# Patient Record
Sex: Female | Born: 1980 | Race: White | Hispanic: No | Smoking: Current every day smoker
Health system: Southern US, Community
[De-identification: ages and names within clinical notes are randomized; demographics above are authoritative.]

## PROBLEM LIST (undated history)

## (undated) DIAGNOSIS — I10 Essential (primary) hypertension: Secondary | ICD-10-CM

---

## 2010-03-05 ENCOUNTER — Encounter: Admission: RE | Admit: 2010-03-05 | Discharge: 2010-03-05 | Payer: Self-pay | Admitting: Orthopedic Surgery

## 2011-10-14 ENCOUNTER — Other Ambulatory Visit: Payer: Self-pay | Admitting: Orthopedic Surgery

## 2011-10-14 ENCOUNTER — Ambulatory Visit
Admission: RE | Admit: 2011-10-14 | Discharge: 2011-10-14 | Disposition: A | Discharge status: Home / Self Care | Payer: Medicaid Other | Source: Ambulatory Visit | Attending: Orthopedic Surgery | Admitting: Orthopedic Surgery

## 2011-10-14 DIAGNOSIS — M545 Low back pain: Secondary | ICD-10-CM

## 2011-11-07 ENCOUNTER — Other Ambulatory Visit (HOSPITAL_BASED_OUTPATIENT_CLINIC_OR_DEPARTMENT_OTHER): Payer: Self-pay | Admitting: Sports Medicine

## 2011-11-07 ENCOUNTER — Other Ambulatory Visit (HOSPITAL_BASED_OUTPATIENT_CLINIC_OR_DEPARTMENT_OTHER): Payer: Self-pay | Admitting: Orthopedic Surgery

## 2011-11-07 DIAGNOSIS — M545 Low back pain: Secondary | ICD-10-CM

## 2011-11-09 ENCOUNTER — Ambulatory Visit (HOSPITAL_BASED_OUTPATIENT_CLINIC_OR_DEPARTMENT_OTHER)
Admission: RE | Admit: 2011-11-09 | Discharge: 2011-11-09 | Disposition: A | Discharge status: Home / Self Care | Payer: Medicaid Other | Source: Ambulatory Visit | Attending: Sports Medicine | Admitting: Sports Medicine

## 2011-11-09 DIAGNOSIS — M79609 Pain in unspecified limb: Secondary | ICD-10-CM | POA: Insufficient documentation

## 2011-11-09 DIAGNOSIS — M545 Low back pain, unspecified: Secondary | ICD-10-CM | POA: Insufficient documentation

## 2012-04-01 ENCOUNTER — Ambulatory Visit: Payer: Medicaid Other | Attending: Orthopedic Surgery | Admitting: Physical Therapy

## 2012-04-01 DIAGNOSIS — M25659 Stiffness of unspecified hip, not elsewhere classified: Secondary | ICD-10-CM | POA: Insufficient documentation

## 2012-04-01 DIAGNOSIS — IMO0001 Reserved for inherently not codable concepts without codable children: Secondary | ICD-10-CM | POA: Insufficient documentation

## 2012-04-01 DIAGNOSIS — M6281 Muscle weakness (generalized): Secondary | ICD-10-CM | POA: Insufficient documentation

## 2012-04-01 DIAGNOSIS — M545 Low back pain, unspecified: Secondary | ICD-10-CM | POA: Insufficient documentation

## 2012-04-06 ENCOUNTER — Encounter: Payer: Medicaid Other | Admitting: Physical Therapy

## 2012-04-13 ENCOUNTER — Ambulatory Visit: Payer: Medicaid Other | Admitting: Physical Therapy

## 2012-04-27 ENCOUNTER — Encounter: Payer: Medicaid Other | Admitting: Physical Therapy

## 2019-11-15 ENCOUNTER — Other Ambulatory Visit: Payer: Self-pay

## 2019-11-15 ENCOUNTER — Emergency Department (INDEPENDENT_AMBULATORY_CARE_PROVIDER_SITE_OTHER)
Admission: RE | Admit: 2019-11-15 | Discharge: 2019-11-15 | Disposition: A | Discharge status: Home / Self Care | Payer: Medicaid Other | Source: Ambulatory Visit

## 2019-11-15 ENCOUNTER — Emergency Department (INDEPENDENT_AMBULATORY_CARE_PROVIDER_SITE_OTHER): Payer: Medicaid Other

## 2019-11-15 VITALS — BP 130/80 | HR 90 | Temp 99.1°F | Resp 16

## 2019-11-15 DIAGNOSIS — R509 Fever, unspecified: Secondary | ICD-10-CM | POA: Diagnosis not present

## 2019-11-15 DIAGNOSIS — R05 Cough: Secondary | ICD-10-CM

## 2019-11-15 DIAGNOSIS — F17200 Nicotine dependence, unspecified, uncomplicated: Secondary | ICD-10-CM | POA: Diagnosis not present

## 2019-11-15 DIAGNOSIS — R059 Cough, unspecified: Secondary | ICD-10-CM

## 2019-11-15 DIAGNOSIS — J209 Acute bronchitis, unspecified: Secondary | ICD-10-CM

## 2019-11-15 DIAGNOSIS — J029 Acute pharyngitis, unspecified: Secondary | ICD-10-CM

## 2019-11-15 DIAGNOSIS — Z20822 Contact with and (suspected) exposure to covid-19: Secondary | ICD-10-CM

## 2019-11-15 DIAGNOSIS — R0981 Nasal congestion: Secondary | ICD-10-CM

## 2019-11-15 HISTORY — DX: Essential (primary) hypertension: I10

## 2019-11-15 MED ORDER — METHYLPREDNISOLONE SODIUM SUCC 40 MG IJ SOLR
80.0000 mg | Freq: Once | INTRAMUSCULAR | Status: AC
Start: 2019-11-15 — End: 2019-11-15
  Administered 2019-11-15: 80 mg via INTRAMUSCULAR

## 2019-11-15 MED ORDER — ALBUTEROL SULFATE HFA 108 (90 BASE) MCG/ACT IN AERS: 1.0000 | INHALATION_SPRAY | Freq: Four times a day (QID) | RESPIRATORY_TRACT | 0 refills | Status: DC | PRN

## 2019-11-15 MED ORDER — PREDNISONE 50 MG PO TABS: 50.0000 mg | ORAL_TABLET | Freq: Every day | ORAL | 0 refills | Status: AC

## 2019-11-15 MED ORDER — DOXYCYCLINE HYCLATE 100 MG PO CAPS: 100.0000 mg | ORAL_CAPSULE | Freq: Two times a day (BID) | ORAL | 0 refills | Status: AC

## 2019-11-15 NOTE — ED Triage Notes (Signed)
Patient presents to Urgent Care with complaints of nasal congestion, cough, runny nose since a week ago. Patient reports she has taken multiple otc medications but has not gotten relief.  Pt has not been tested for covid recently or vaccinated, would like to be tested today.

## 2019-11-15 NOTE — ED Provider Notes (Signed)
[name]    CSN: [id number] Arrival date & time: 11/15/19  1005      History   Chief Complaint Chief Complaint  Patient presents with  . Appointment    10:00  . Nasal Congestion    HPI Haley Taylor is a 39 y.o. female.   HPI Haley Taylor is a 39 y.o. female presenting to UC with c/o gradually worsening nasal congestion, mildly productive cough, and runny nose for 1 week.  She has taken multiple OTC medications without relief.  She has not been tested for covid and has not received the vaccine. She would like to be tested today. Denies fever, chills, n/v/d.    Past Medical History:  Diagnosis Date  . Hypertension     There are no problems to display for this patient.   History reviewed. No pertinent surgical history.  OB History   No obstetric history on file.      Home Medications    Prior to Admission medications   Medication Sig Start Date End Date Taking? Authorizing Provider  aspirin 81 MG chewable tablet Chew by mouth daily.   Yes [provider]  cetirizine (ZYRTEC) 5 MG tablet Take 5 mg by mouth daily.   Yes [provider]  Cyanocobalamin (VITAMIN B 12 PO) Take by mouth.   Yes [provider]  escitalopram (LEXAPRO) 20 MG tablet Take 20 mg by mouth daily.   Yes [provider]  ferrous sulfate 325 (65 FE) MG tablet Take 325 mg by mouth daily with breakfast.   Yes [provider]  lisinopril-hydrochlorothiazide (ZESTORETIC) 20-25 MG tablet Take 1 tablet by mouth daily.   Yes [provider]  albuterol (VENTOLIN HFA) 108 (90 Base) MCG/ACT inhaler Inhale 1-2 puffs into the lungs every 6 (six) hours as needed for wheezing or shortness of breath. 11/15/19   Lurene Shadow, PA-C  doxycycline (VIBRAMYCIN) 100 MG capsule Take 1 capsule (100 mg total) by mouth 2 (two) times daily for 7 days. 11/15/19 11/22/19  Lurene Shadow, PA-C  predniSONE (DELTASONE) 50 MG tablet Take 1 tablet (50 mg  total) by mouth daily with breakfast for 5 days. 11/15/19 11/20/19  Lurene Shadow, PA-C    Family History Family History  Problem Relation Age of Onset  . Healthy Mother     Social History Social History   Tobacco Use  . Smoking status: Current Every Day Smoker    Packs/day: 1.00    Types: Cigarettes  . Smokeless tobacco: Never Used  Substance Use Topics  . Alcohol use: Not Currently  . Drug use: Not on file     Allergies   Amoxicillin and Keflex [cephalexin]   Review of Systems Review of Systems  Constitutional: Negative for chills and fever.  HENT: Positive for congestion, ear pain, rhinorrhea and sore throat. Negative for trouble swallowing and voice change.   Respiratory: Positive for cough. Negative for shortness of breath.   Cardiovascular: Negative for chest pain and palpitations.  Gastrointestinal: Negative for abdominal pain, diarrhea, nausea and vomiting.  Musculoskeletal: Negative for arthralgias, back pain and myalgias.  Skin: Negative for rash.  Neurological: Positive for headaches. Negative for dizziness and light-headedness.  All other systems reviewed and are negative.    Physical Exam Triage Vital Signs ED Triage Vitals  Enc Vitals Group     BP 11/15/19 1023 130/80     Pulse Rate 11/15/19 1023 90     Resp 11/15/19 1023 16  Temp 11/15/19 1023 99.1 F (37.3 C)     Temp Source 11/15/19 1023 Oral     SpO2 11/15/19 1023 96 %     Weight --      Height --      Head Circumference --      Peak Flow --      Pain Score 11/15/19 1019 6     Pain Loc --      Pain Edu? --      Excl. in GC? --    No data found.  Updated Vital Signs BP 130/80 (BP Location: Right Arm)   Pulse 90   Temp 99.1 F (37.3 C) (Oral)   Resp 16   SpO2 96%   Visual Acuity Right Eye Distance:   Left Eye Distance:   Bilateral Distance:    Right Eye Near:   Left Eye Near:    Bilateral Near:     Physical Exam Vitals and nursing note reviewed.  Constitutional:       Appearance: Normal appearance. She is well-developed.  HENT:     Head: Normocephalic and atraumatic.     Right Ear: Tympanic membrane and ear canal normal.     Left Ear: Tympanic membrane and ear canal normal.     Nose: Nose normal.     Right Sinus: No maxillary sinus tenderness or frontal sinus tenderness.     Left Sinus: No maxillary sinus tenderness or frontal sinus tenderness.     Mouth/Throat:     Lips: Pink.     Mouth: Mucous membranes are moist.     Pharynx: Oropharynx is clear. Uvula midline.  Cardiovascular:     Rate and Rhythm: Normal rate and regular rhythm.  Pulmonary:     Effort: Pulmonary effort is normal.     Breath sounds: Wheezing and rhonchi present.     Comments: Diffuse wheeze and rhonchi without respiratory distress. Mildly productive cough throughout the exam. Musculoskeletal:        General: Normal range of motion.     Cervical back: Normal range of motion.  Skin:    General: Skin is warm and dry.  Neurological:     Mental Status: She is alert and oriented to person, place, and time.  Psychiatric:        Behavior: Behavior normal.      UC Treatments / Results  Labs (all labs ordered are listed, but only abnormal results are displayed) Labs Reviewed  SARS-COV-2 RNA,(COVID-19) QUALITATIVE NAAT    EKG   Radiology CLINICAL DATA:  1wk of cough, fever, sore throat, sinus congestion. Smoker.  EXAM: CHEST - 2 VIEW  COMPARISON:  None.  FINDINGS: The heart size and mediastinal contours are within normal limits. Mild coarsening of the interstitium bilaterally. No focal consolidation. No pneumothorax or pleural effusion. The visualized skeletal structures are unremarkable.  IMPRESSION: Bronchitic changes, possibly chronic related to smoking history.   Electronically Signed   By: Emmaline Kluver M.D.   On: 11/15/2019 10:58  Procedures Procedures (including critical care time)  Medications Ordered in UC Medications    methylPREDNISolone sodium succinate (SOLU-MEDROL) 40 mg/mL injection 80 mg (80 mg Intramuscular Given 11/15/19 1103)    Initial Impression / Assessment and Plan / UC Course  I have reviewed the triage vital signs and the nursing notes.  Pertinent labs & imaging results that were available during my care of the patient were reviewed by me and considered in my medical decision making (see chart for details).  Will tx for bacterial bronchitis Encouraged f/u with PCP AVS given  Final Clinical Impressions(s) / UC Diagnoses   Final diagnoses:  Cough  Acute bronchitis, unspecified organism  Suspected COVID-19 virus infection     Discharge Instructions      Please take antibiotics as prescribed and be sure to complete entire course even if you start to feel better to ensure infection does not come back.  You may take 500mg  acetaminophen every 4-6 hours or in combination with ibuprofen 400-600mg  every 6-8 hours as needed for pain, inflammation, and fever.  Be sure to well hydrated with clear liquids and get at least 8 hours of sleep at night, preferably more while sick.   Please follow up with family medicine in 1 week if needed.  Call 911 or have someone drive you to the hospital if you develop chest pain, worse trouble breathing, dizziness/passing out, or other new concerning symptoms develop.     ED Prescriptions    Medication Sig Dispense Auth. Provider   predniSONE (DELTASONE) 50 MG tablet Take 1 tablet (50 mg total) by mouth daily with breakfast for 5 days. 5 tablet , Nolie Bignell O, PA-C   albuterol (VENTOLIN HFA) 108 (90 Base) MCG/ACT inhaler Inhale 1-2 puffs into the lungs every 6 (six) hours as needed for wheezing or shortness of breath. 8 g 12-[birth year]-1986, PA-C   doxycycline (VIBRAMYCIN) 100 MG capsule Take 1 capsule (100 mg total) by mouth 2 (two) times daily for 7 days. 14 capsule Lurene Shadow, Lurene Shadow     PDMP not reviewed this encounter.   [place],  PA-C 11/18/19 1022

## 2019-11-15 NOTE — Discharge Instructions (Signed)
°  Please take antibiotics as prescribed and be sure to complete entire course even if you start to feel better to ensure infection does not come back.  You may take 500mg  acetaminophen every 4-6 hours or in combination with ibuprofen 400-600mg  every 6-8 hours as needed for pain, inflammation, and fever.  Be sure to well hydrated with clear liquids and get at least 8 hours of sleep at night, preferably more while sick.   Please follow up with family medicine in 1 week if needed.  Call 911 or have someone drive you to the hospital if you develop chest pain, worse trouble breathing, dizziness/passing out, or other new concerning symptoms develop.

## 2019-11-16 LAB — SARS-COV-2 RNA,(COVID-19) QUALITATIVE NAAT: SARS CoV2 RNA: NOT DETECTED

## 2020-04-17 ENCOUNTER — Emergency Department (INDEPENDENT_AMBULATORY_CARE_PROVIDER_SITE_OTHER)
Admission: EM | Admit: 2020-04-17 | Discharge: 2020-04-17 | Disposition: A | Discharge status: Home / Self Care | Payer: Medicaid Other | Source: Home / Self Care

## 2020-04-17 ENCOUNTER — Other Ambulatory Visit: Payer: Self-pay

## 2020-04-17 ENCOUNTER — Emergency Department (INDEPENDENT_AMBULATORY_CARE_PROVIDER_SITE_OTHER): Payer: Medicaid Other

## 2020-04-17 ENCOUNTER — Encounter: Payer: Self-pay | Admitting: Emergency Medicine

## 2020-04-17 DIAGNOSIS — S0591XA Unspecified injury of right eye and orbit, initial encounter: Secondary | ICD-10-CM | POA: Diagnosis not present

## 2020-04-17 DIAGNOSIS — H538 Other visual disturbances: Secondary | ICD-10-CM

## 2020-04-17 DIAGNOSIS — H05231 Hemorrhage of right orbit: Secondary | ICD-10-CM
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
## 2020-04-17 NOTE — Discharge Instructions (Signed)
°  Due to worsening eye pain and vision without definite scratches or fractures seen in urgent care this evening, it is recommended you have someone drive you to the hospital this evening for further evaluation of your eye where more resources are available.
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
## 2020-05-02 ENCOUNTER — Emergency Department (INDEPENDENT_AMBULATORY_CARE_PROVIDER_SITE_OTHER)
Admission: EM | Admit: 2020-05-02 | Discharge: 2020-05-02 | Disposition: A | Discharge status: Home / Self Care | Payer: Medicaid Other | Source: Home / Self Care

## 2020-05-02 ENCOUNTER — Encounter: Payer: Self-pay | Admitting: Emergency Medicine

## 2020-05-02 ENCOUNTER — Other Ambulatory Visit: Payer: Self-pay

## 2020-05-02 DIAGNOSIS — J069 Acute upper respiratory infection, unspecified: Secondary | ICD-10-CM

## 2020-05-02 MED ORDER — ALBUTEROL SULFATE HFA 108 (90 BASE) MCG/ACT IN AERS: 1.0000 | INHALATION_SPRAY | Freq: Four times a day (QID) | RESPIRATORY_TRACT | 0 refills | Status: AC | PRN

## 2020-05-02 MED ORDER — BENZONATATE 100 MG PO CAPS: 100.0000 mg | ORAL_CAPSULE | Freq: Three times a day (TID) | ORAL | 0 refills | Status: DC | PRN

## 2020-05-02 MED ORDER — PREDNISONE 20 MG PO TABS: 40.0000 mg | ORAL_TABLET | Freq: Every day | ORAL | 0 refills | Status: DC

## 2020-05-02 NOTE — Discharge Instructions (Addendum)
Your COVID 19 results will be available in 3-5 days. Activate MyChart to view your results. Positive results will receive a follow-up call from our clinic. If symptoms are present, I recommend home quarantine until results are known.

## 2020-05-02 NOTE — ED Triage Notes (Signed)
Nasal congestion, Left chest congestion x 1 week Unvaccinated

## 2020-05-02 NOTE — ED Provider Notes (Signed)
[name]    CSN: [id number] Arrival date & time: 05/02/20  1129      History   Chief Complaint Chief Complaint  Patient presents with  . Nasal Congestion    HPI Haley Taylor is a 40 y.o. female.   HPI  Patient presents today with 1 week history of cough, sinus congestion, otalgia.  Patient has been afebrile. She has had sick contacts although unaware if persons have been infected with COVID or flu. Endorses wheezing and shortness of breath intermittently. History of bronchitis. Current smoker. Past Medical History:  Diagnosis Date  . Hypertension     There are no problems to display for this patient.   History reviewed. No pertinent surgical history.  OB History   No obstetric history on file.      Home Medications    Prior to Admission medications   Medication Sig Start Date End Date Taking? Authorizing Provider  benzonatate (TESSALON) 100 MG capsule Take 1-2 capsules (100-200 mg total) by mouth 3 (three) times daily as needed for cough. 05/02/20  Yes Bing Neighbors, FNP  predniSONE (DELTASONE) 20 MG tablet Take 2 tablets (40 mg total) by mouth daily with breakfast. 05/02/20  Yes Bing Neighbors, FNP  albuterol (VENTOLIN HFA) 108 (90 Base) MCG/ACT inhaler Inhale 1-2 puffs into the lungs every 6 (six) hours as needed for wheezing or shortness of breath. 05/02/20   Bing Neighbors, FNP  aspirin 81 MG chewable tablet Chew by mouth daily.    [provider]  cetirizine (ZYRTEC) 5 MG tablet Take 5 mg by mouth daily.    [provider]  Cyanocobalamin (VITAMIN B 12 PO) Take by mouth.    [provider]  escitalopram (LEXAPRO) 20 MG tablet Take 20 mg by mouth daily.    [provider]  ferrous sulfate 325 (65 FE) MG tablet Take 325 mg by mouth daily with breakfast.    [provider]  lisinopril-hydrochlorothiazide (ZESTORETIC) 20-25 MG tablet Take 1 tablet by mouth daily.    [provider]     Family History Family History  Problem Relation Age of Onset  . COPD Mother     Social History Social History   Tobacco Use  . Smoking status: Current Every Day Smoker    Packs/day: 1.00    Types: Cigarettes  . Smokeless tobacco: Never Used  Vaping Use  . Vaping Use: Never used  Substance Use Topics  . Alcohol use: Not Currently     Allergies   Amoxicillin and Keflex [cephalexin]   Review of Systems Review of Systems Pertinent negatives listed in HPI Physical Exam Triage Vital Signs ED Triage Vitals  Enc Vitals Group     BP 05/02/20 1226 136/78     Pulse Rate 05/02/20 1226 75     Resp 05/02/20 1226 17     Temp 05/02/20 1226 98.3 F (36.8 C)     Temp Source 05/02/20 1226 Oral     SpO2 05/02/20 1226 98 %     Weight 05/02/20 1227 200 lb (90.7 kg)     Height 05/02/20 1227 5\' 7"  (1.702 m)     Head Circumference --      Peak Flow --      Pain Score 05/02/20 1227 5     Pain Loc --      Pain Edu? --      Excl. in GC? --    No data found.  Updated Vital Signs BP 136/78 (  BP Location: Right Arm)   Pulse 75   Temp 98.3 F (36.8 C) (Oral)   Resp 17   Ht 5\' 7"  (1.702 m)   Wt 200 lb (90.7 kg)   LMP 04/11/2020 (Exact Date)   SpO2 98%   BMI 31.32 kg/m   Visual Acuity Right Eye Distance:   Left Eye Distance:   Bilateral Distance:    Right Eye Near:   Left Eye Near:    Bilateral Near:     Physical Exam General appearance: alert, Ill-appearing, no distress Head: Normocephalic, without obvious abnormality, atraumatic ENT: mucosal edema, congestion, oropharynx  Respiratory: Respirations even , unlabored, coarse lung sound, expiratory wheeze Heart: rate and rhythm normal. No gallop or murmurs noted on exam  Abdomen: BS +, no distention, no rebound tenderness, or no mass Extremities: No gross deformities Skin: Skin color, texture, turgor normal. No rashes seen  Psych: Appropriate mood and affect. Neurologic: Mental status: Alert, oriented to person,  place, and time, thought content appropriate. UC Treatments / Results  Labs (all labs ordered are listed, but only abnormal results are displayed) Labs Reviewed  COVID-19, FLU A+B NAA    EKG   Radiology No results found.  Procedures Procedures (including critical care time)  Medications Ordered in UC Medications - No data to display  Initial Impression / Assessment and Plan / UC Course  I have reviewed the triage vital signs and the nursing notes.  Pertinent labs & imaging results that were available during my care of the patient were reviewed by me and considered in my medical decision making (see chart for details).     COVID/Flu test pending. Symptom management warranted only. Treatment per discharge medications. Red flag precautions given.  Final Clinical Impressions(s) / UC Diagnoses   Final diagnoses:  Viral URI with cough     Discharge Instructions     Your COVID 19 results will be available in 3-5 days. Activate MyChart to view your results. Positive results will receive a follow-up call from our clinic. If symptoms are present, I recommend home quarantine until results are known.     ED Prescriptions    Medication Sig Dispense Auth. Provider   albuterol (VENTOLIN HFA) 108 (90 Base) MCG/ACT inhaler Inhale 1-2 puffs into the lungs every 6 (six) hours as needed for wheezing or shortness of breath. 8 g 04/13/2020, FNP   predniSONE (DELTASONE) 20 MG tablet Take 2 tablets (40 mg total) by mouth daily with breakfast. 10 tablet Bing Neighbors, FNP   benzonatate (TESSALON) 100 MG capsule Take 1-2 capsules (100-200 mg total) by mouth 3 (three) times daily as needed for cough. 40 capsule Bing Neighbors, FNP     PDMP not reviewed this encounter.   Bing Neighbors, FNP 05/02/20 1332

## 2020-05-04 LAB — COVID-19, FLU A+B NAA
Influenza A, NAA: NOT DETECTED
Influenza B, NAA: NOT DETECTED
SARS-CoV-2, NAA: NOT DETECTED

## 2020-11-01 ENCOUNTER — Other Ambulatory Visit: Payer: Self-pay | Admitting: Family Medicine

## 2021-09-19 ENCOUNTER — Emergency Department (INDEPENDENT_AMBULATORY_CARE_PROVIDER_SITE_OTHER)
Admission: EM | Admit: 2021-09-19 | Discharge: 2021-09-19 | Disposition: A | Discharge status: Home / Self Care | Payer: Medicaid Other | Source: Home / Self Care

## 2021-09-19 ENCOUNTER — Emergency Department (INDEPENDENT_AMBULATORY_CARE_PROVIDER_SITE_OTHER): Payer: Medicaid Other

## 2021-09-19 DIAGNOSIS — M545 Low back pain, unspecified: Secondary | ICD-10-CM

## 2021-09-19 DIAGNOSIS — M5432 Sciatica, left side: Secondary | ICD-10-CM

## 2021-09-19 DIAGNOSIS — S39012A Strain of muscle, fascia and tendon of lower back, initial encounter: Secondary | ICD-10-CM

## 2021-09-19 MED ORDER — METHYLPREDNISOLONE SODIUM SUCC 125 MG IJ SOLR
125.0000 mg | Freq: Once | INTRAMUSCULAR | Status: AC
  Administered 2021-09-19: 125 mg via INTRAMUSCULAR

## 2021-09-19 MED ORDER — BACLOFEN 10 MG PO TABS: 10.0000 mg | ORAL_TABLET | Freq: Three times a day (TID) | ORAL | 0 refills | Status: DC

## 2021-09-19 MED ORDER — METHYLPREDNISOLONE 4 MG PO TBPK: ORAL_TABLET | ORAL | 0 refills | Status: DC

## 2021-09-19 NOTE — ED Provider Notes (Signed)
[facility]    CSN: [id number] Arrival date & time: 09/19/21  1033      History   Chief Complaint Chief Complaint  Patient presents with   Back Pain    HPI Haley Taylor is a 41 y.o. female.   HPI 41 year old female presents with lower back pain since yesterday, reports she bent down to pick something up when her lower back locked up on her.  Reports previous history of back surgeries.  Patient reports using heat, ice, and pain meds as needed, reports current lower back pain as 7 of 10.  PMH significant for obesity and HTN.  MRI of 11/09/2011 revealed mild annular bulging of L1-L2 with left L5 and left S1 nerve root compression are present.  Past Medical History:  Diagnosis Date   Hypertension     There are no problems to display for this patient.   History reviewed. No pertinent surgical history.  OB History   No obstetric history on file.      Home Medications    Prior to Admission medications   Medication Sig Start Date End Date Taking? Authorizing Provider  baclofen (LIORESAL) 10 MG tablet Take 1 tablet (10 mg total) by mouth 3 (three) times daily. 09/19/21  Yes Trevor Iha, FNP  methylPREDNISolone (MEDROL DOSEPAK) 4 MG TBPK tablet Take as directed 09/19/21  Yes Trevor Iha, FNP  albuterol (VENTOLIN HFA) 108 (90 Base) MCG/ACT inhaler Inhale 1-2 puffs into the lungs every 6 (six) hours as needed for wheezing or shortness of breath. 05/02/20   Bing Neighbors, FNP  aspirin 81 MG chewable tablet Chew by mouth daily.    [provider]  cetirizine (ZYRTEC) 5 MG tablet Take 5 mg by mouth daily.    [provider]  Cyanocobalamin (VITAMIN B 12 PO) Take by mouth.    [provider]  escitalopram (LEXAPRO) 20 MG tablet Take 20 mg by mouth daily.    [provider]  ferrous sulfate 325 (65 FE) MG tablet Take 325 mg by mouth daily with breakfast.    [provider]  lisinopril-hydrochlorothiazide (ZESTORETIC)  20-25 MG tablet Take 1 tablet by mouth daily.    [provider]    Family History Family History  Problem Relation Age of Onset   COPD Mother     Social History Social History   Tobacco Use   Smoking status: Every Day    Packs/day: 1.00    Types: Cigarettes   Smokeless tobacco: Never  Vaping Use   Vaping Use: Never used  Substance Use Topics   Alcohol use: Not Currently     Allergies   Amoxicillin and Keflex [cephalexin]   Review of Systems Review of Systems  Musculoskeletal:  Positive for back pain.    Physical Exam Triage Vital Signs ED Triage Vitals  Enc Vitals Group     BP 09/19/21 1047 (!) 145/86     Pulse Rate 09/19/21 1047 78     Resp 09/19/21 1047 17     Temp 09/19/21 1047 98.1 F (36.7 C)     Temp Source 09/19/21 1047 Oral     SpO2 09/19/21 1047 99 %     Weight --      Height --      Head Circumference --      Peak Flow --      Pain Score 09/19/21 1049 7     Pain Loc --      Pain Edu? --  Excl. in GC? --    No data found.  Updated Vital Signs BP (!) 145/86 (BP Location: Right Arm)   Pulse 78   Temp 98.1 F (36.7 C) (Oral)   Resp 17   LMP 09/14/2021 (Approximate)   SpO2 99%       Physical Exam Vitals and nursing note reviewed.  Constitutional:      Appearance: Normal appearance. She is normal weight.  HENT:     Head: Normocephalic and atraumatic.     Mouth/Throat:     Mouth: Mucous membranes are moist.     Pharynx: Oropharynx is clear.  Eyes:     Extraocular Movements: Extraocular movements intact.     Conjunctiva/sclera: Conjunctivae normal.     Pupils: Pupils are equal, round, and reactive to light.  Cardiovascular:     Rate and Rhythm: Normal rate and regular rhythm.     Pulses: Normal pulses.     Heart sounds: Normal heart sounds.  Pulmonary:     Effort: Pulmonary effort is normal.     Breath sounds: Normal breath sounds. No wheezing, rhonchi or rales.  Musculoskeletal:     Cervical back: Normal range of  motion and neck supple.     Comments: Lumbar sacral spine: TTP over left-sided paraspinous muscles and inferior spinal erectors, no deformity noted  Skin:    General: Skin is warm and dry.  Neurological:     General: No focal deficit present.     Mental Status: She is alert and oriented to person, place, and time. Mental status is at baseline.     UC Treatments / Results  Labs (all labs ordered are listed, but only abnormal results are displayed) Labs Reviewed - No data to display  EKG   Radiology DG Lumbar Spine Complete  Result Date: 09/19/2021 CLINICAL DATA:  Acute lower back pain. EXAM: LUMBAR SPINE - COMPLETE 4+ VIEW COMPARISON:  October 14, 2011. FINDINGS: There is no evidence of lumbar spine fracture. Alignment is normal. Moderate degenerative disc disease is noted at L1-2 and L3-4 and L5-S1. IMPRESSION: Moderate multilevel degenerative disc disease. No acute abnormality is noted. Electronically Signed   By: Lupita RaiderJames  Green Jr M.D.   On: 09/19/2021 11:51    Procedures Procedures (including critical care time)  Medications Ordered in UC Medications  methylPREDNISolone sodium succinate (SOLU-MEDROL) 125 mg/2 mL injection 125 mg (125 mg Intramuscular Given 09/19/21 1208)    Initial Impression / Assessment and Plan / UC Course  I have reviewed the triage vital signs and the nursing notes.  Pertinent labs & imaging results that were available during my care of the patient were reviewed by me and considered in my medical decision making (see chart for details).     MDM: 1.  Sciatica of left side-IM Solu-Medrol 125 mg given once in clinic, Rx'd Medrol Dosepak; 2.  Strain of lumbar region, initial encounter-Rx'd Baclofen. Advised patient of lumbar spine x-ray results with hard copy provided to patient.  Instructed patient to take medication as directed with food to completion.  Advised patient may take baclofen daily or as needed for accompanying muscle spasms.  Advised/encouraged  patient to avoid repetitive overuse, offending activities involving affected area of left lower back for the next 7 to 10 days. Encouraged patient to increase daily water intake while taking these medications.  Advised patient if symptoms worsen and/or unresolved please follow-up with PCP or here for further evaluation.  Final Clinical Impressions(s) / UC Diagnoses   Final diagnoses:  Strain  of lumbar region, initial encounter  Sciatica of left side     Discharge Instructions      Advised patient of lumbar spine x-ray results with hard copy provided to patient.  Instructed patient to take medication as directed with food to completion.  Advised patient may take baclofen daily or as needed for accompanying muscle spasms.  Advised/encouraged patient to avoid repetitive overuse, offending activities involving affected area of left lower back for the next 7 to 10 days.  Encouraged patient to increase daily water intake while taking these medications.  Advised patient if symptoms worsen and/or unresolved please follow-up with PCP or here for further evaluation.     ED Prescriptions     Medication Sig Dispense Auth. Provider   methylPREDNISolone (MEDROL DOSEPAK) 4 MG TBPK tablet Take as directed 1 each Trevor Iha, FNP   baclofen (LIORESAL) 10 MG tablet Take 1 tablet (10 mg total) by mouth 3 (three) times daily. 30 each Trevor Iha, FNP      I have reviewed the PDMP during this encounter.   Trevor Iha, FNP 09/19/21 1215

## 2021-09-19 NOTE — Discharge Instructions (Addendum)
Advised patient of lumbar spine x-ray results with hard copy provided to patient.  Instructed patient to take medication as directed with food to completion.  Advised patient may take baclofen daily or as needed for accompanying muscle spasms.  Advised/encouraged patient to avoid repetitive overuse, offending activities involving affected area of left lower back for the next 7 to 10 days.  Encouraged patient to increase daily water intake while taking these medications.  Advised patient if symptoms worsen and/or unresolved please follow-up with PCP or here for further evaluation.

## 2021-09-19 NOTE — ED Triage Notes (Signed)
Pt c/o back pain since yesterday. Says she bent down to pick something up when "it locked up". Previous hx of back surgeries. Heat, ice and pain meds prn. Pain 7/10

## 2022-02-13 ENCOUNTER — Encounter: Payer: Self-pay | Admitting: Emergency Medicine

## 2022-02-13 ENCOUNTER — Ambulatory Visit
Admission: EM | Admit: 2022-02-13 | Discharge: 2022-02-13 | Disposition: A | Discharge status: Home / Self Care | Payer: Medicaid Other | Attending: Family Medicine | Admitting: Family Medicine

## 2022-02-13 DIAGNOSIS — J4521 Mild intermittent asthma with (acute) exacerbation: Secondary | ICD-10-CM | POA: Diagnosis not present

## 2022-02-13 LAB — POC SARS CORONAVIRUS 2 AG -  ED: SARS Coronavirus 2 Ag: NEGATIVE

## 2022-02-13 MED ORDER — HYDROCOD POLI-CHLORPHE POLI ER 10-8 MG/5ML PO SUER: 5.0000 mL | Freq: Two times a day (BID) | ORAL | 0 refills | Status: DC | PRN

## 2022-02-13 MED ORDER — AZITHROMYCIN 250 MG PO TABS
ORAL_TABLET | ORAL | 0 refills | Status: DC
Start: 2022-02-13 — End: 2023-09-04

## 2022-02-13 MED ORDER — PREDNISONE 20 MG PO TABS: 40.0000 mg | ORAL_TABLET | Freq: Every day | ORAL | 0 refills | Status: DC

## 2022-02-13 NOTE — ED Triage Notes (Signed)
Pt c/o cough, nasal and chest congestion since last Friday. States tmax 100 at home. Taking OTC cough and cold meds with no relief.

## 2022-02-13 NOTE — ED Provider Notes (Signed)
[name]    CSN: [id number] Arrival date & time: 02/13/22  1654      History   Chief Complaint Chief Complaint  Patient presents with   Nasal Congestion    HPI Haley Taylor is a 41 y.o. female.   HPI Patient is a smoker.  She is here with her upper respiratory infection cough.  Shortness of breath and wheezing.  Has been using her albuterol inhaler.  Has not been able to work this week because of the severe cough.  She is coughing up sputum.  A COVID test at home was negative.  She has had cough, nasal and chest congestion, shortness of breath, headache and body aches.  Tmax at home was 100 degrees. Past Medical History:  Diagnosis Date   Hypertension     There are no problems to display for this patient.   History reviewed. No pertinent surgical history.  OB History   No obstetric history on file.      Home Medications    Prior to Admission medications   Medication Sig Start Date End Date Taking? Authorizing Provider  azithromycin (ZITHROMAX Z-PAK) 250 MG tablet Take 2 pills right away.  After this take 1 pill a day until gone 02/13/22  Yes Eustace Moore, MD  chlorpheniramine-HYDROcodone (TUSSIONEX) 10-8 MG/5ML Take 5 mLs by mouth every 12 (twelve) hours as needed for cough. 02/13/22  Yes Eustace Moore, MD  predniSONE (DELTASONE) 20 MG tablet Take 2 tablets (40 mg total) by mouth daily with breakfast. 02/13/22  Yes Eustace Moore, MD  albuterol (VENTOLIN HFA) 108 (90 Base) MCG/ACT inhaler Inhale 1-2 puffs into the lungs every 6 (six) hours as needed for wheezing or shortness of breath. 05/02/20   Bing Neighbors, FNP  aspirin 81 MG chewable tablet Chew by mouth daily.    [provider]  cetirizine (ZYRTEC) 5 MG tablet Take 5 mg by mouth daily.    [provider]  Cyanocobalamin (VITAMIN B 12 PO) Take by mouth.    [provider]  escitalopram (LEXAPRO) 20 MG tablet Take 20 mg by mouth daily.    [provider]  ferrous sulfate 325 (65 FE) MG tablet Take 325 mg by mouth daily with breakfast.    [provider]  lisinopril-hydrochlorothiazide (ZESTORETIC) 20-25 MG tablet Take 1 tablet by mouth daily.    [provider]    Family History Family History  Problem Relation Age of Onset   COPD Mother     Social History Social History   Tobacco Use   Smoking status: Every Day    Packs/day: 1.00    Types: Cigarettes   Smokeless tobacco: Never  Vaping Use   Vaping Use: Never used  Substance Use Topics   Alcohol use: Not Currently     Allergies   Amoxicillin and Keflex [cephalexin]   Review of Systems Review of Systems See HPI  Physical Exam Triage Vital Signs ED Triage Vitals  Enc Vitals Group     BP 02/13/22 1702 (!) 144/84     Pulse Rate 02/13/22 1702 86     Resp 02/13/22 1702 16     Temp 02/13/22 1702 98.7 F (37.1 C)     Temp Source 02/13/22 1702 Oral     SpO2 02/13/22 1702 93 %     Weight --      Height --      Head Circumference --      Peak Flow --  Pain Score 02/13/22 1703 0     Pain Loc --      Pain Edu? --      Excl. in GC? --    No data found.  Updated Vital Signs BP (!) 144/84 (BP Location: Right Arm)   Pulse 86   Temp 98.7 F (37.1 C) (Oral)   Resp 16   LMP 01/30/2022 (Approximate)   SpO2 93%       Physical Exam Constitutional:      General: She is not in acute distress.    Appearance: She is well-developed. She is ill-appearing.  HENT:     Head: Normocephalic and atraumatic.     Right Ear: Tympanic membrane and ear canal normal.     Left Ear: Tympanic membrane and ear canal normal.     Nose: Nose normal.     Mouth/Throat:     Mouth: Mucous membranes are moist.     Pharynx: No posterior oropharyngeal erythema.  Eyes:     Conjunctiva/sclera: Conjunctivae normal.     Pupils: Pupils are equal, round, and reactive to light.  Cardiovascular:     Rate and Rhythm: Normal rate and regular rhythm.     Heart  sounds: Normal heart sounds.  Pulmonary:     Effort: Pulmonary effort is normal. No respiratory distress.     Breath sounds: Wheezing and rhonchi present.  Abdominal:     General: There is no distension.     Palpations: Abdomen is soft.  Musculoskeletal:        General: Normal range of motion.     Cervical back: Normal range of motion.  Lymphadenopathy:     Cervical: No cervical adenopathy.  Skin:    General: Skin is warm and dry.  Neurological:     Mental Status: She is alert.  Psychiatric:        Mood and Affect: Mood normal.        Behavior: Behavior normal.      UC Treatments / Results  Labs (all labs ordered are listed, but only abnormal results are displayed) Labs Reviewed  POC SARS CORONAVIRUS 2 AG -  ED   COVID test is negative EKG   Radiology No results found.  Procedures Procedures (including critical care time)  Medications Ordered in UC Medications - No data to display  Initial Impression / Assessment and Plan / UC Course  I have reviewed the triage vital signs and the nursing notes.  Pertinent labs & imaging results that were available during my care of the patient were reviewed by me and considered in my medical decision making (see chart for details).    Final Clinical Impressions(s) / UC Diagnoses   Final diagnoses:  Mild intermittent asthmatic bronchitis with acute exacerbation     Discharge Instructions      Drink lots of fluids Take the antibiotic as directed.  Take 2 azithromycin today, then 1 a day until gone Take prednisone 40 mg a day for 5 days.  Take with food I have prescribed cough medicine to use as needed.  This can cause drowsiness Monitor humidifier in the bedroom if you have 1 available See your primary care doctor if not improving by next week   ED Prescriptions     Medication Sig Dispense Auth. Provider   azithromycin (ZITHROMAX Z-PAK) 250 MG tablet Take 2 pills right away.  After this take 1 pill a day until gone  6 tablet Eustace Moore, MD   chlorpheniramine-HYDROcodone (TUSSIONEX) 10-8 MG/5ML  Take 5 mLs by mouth every 12 (twelve) hours as needed for cough. 115 mL Raylene Everts, MD   predniSONE (DELTASONE) 20 MG tablet Take 2 tablets (40 mg total) by mouth daily with breakfast. 10 tablet Raylene Everts, MD      PDMP not reviewed this encounter.   Raylene Everts, MD 02/13/22 1736

## 2022-02-13 NOTE — Discharge Instructions (Signed)
Drink lots of fluids Take the antibiotic as directed.  Take 2 azithromycin today, then 1 a day until gone Take prednisone 40 mg a day for 5 days.  Take with food I have prescribed cough medicine to use as needed.  This can cause drowsiness Monitor humidifier in the bedroom if you have 1 available See your primary care doctor if not improving by next week

## 2022-12-16 IMAGING — DX DG LUMBAR SPINE COMPLETE 4+V
5 series · 5 of 5 positions shown · non-contrast
Comparison: October 14, 2011.

CLINICAL DATA: Acute lower back pain.

EXAM:
LUMBAR SPINE - COMPLETE 4+ VIEW

[l-spine ap]
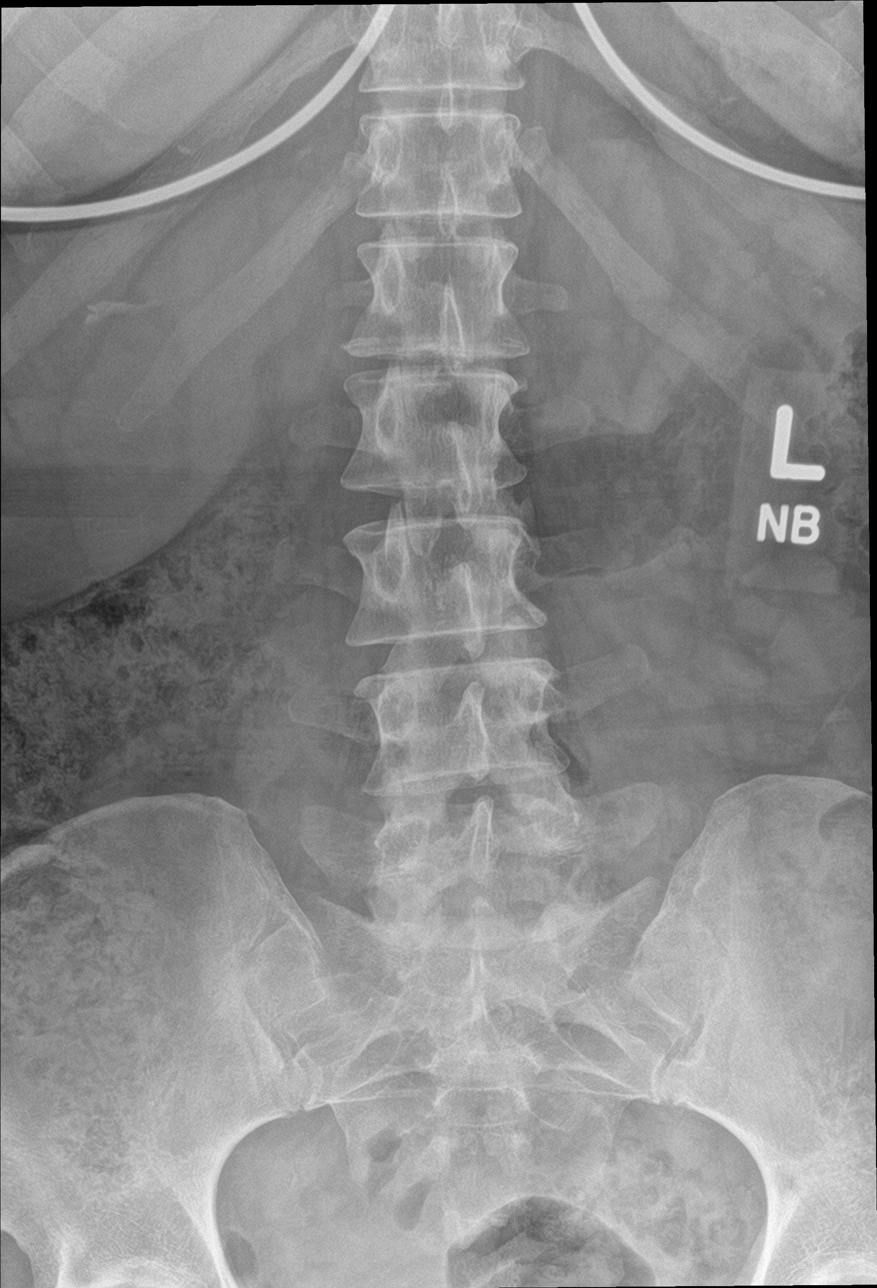

[l-spine obl (1 of 2)]
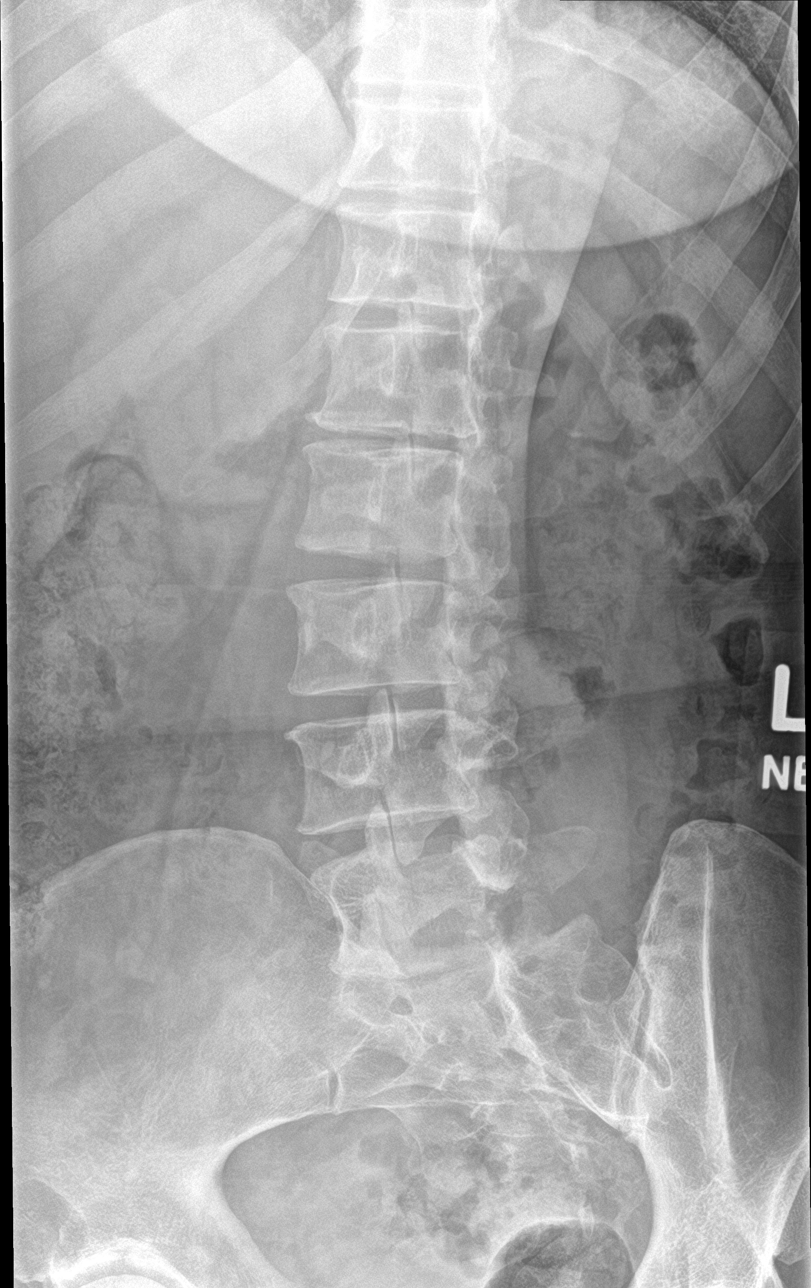

[l-spine obl (2 of 2)]
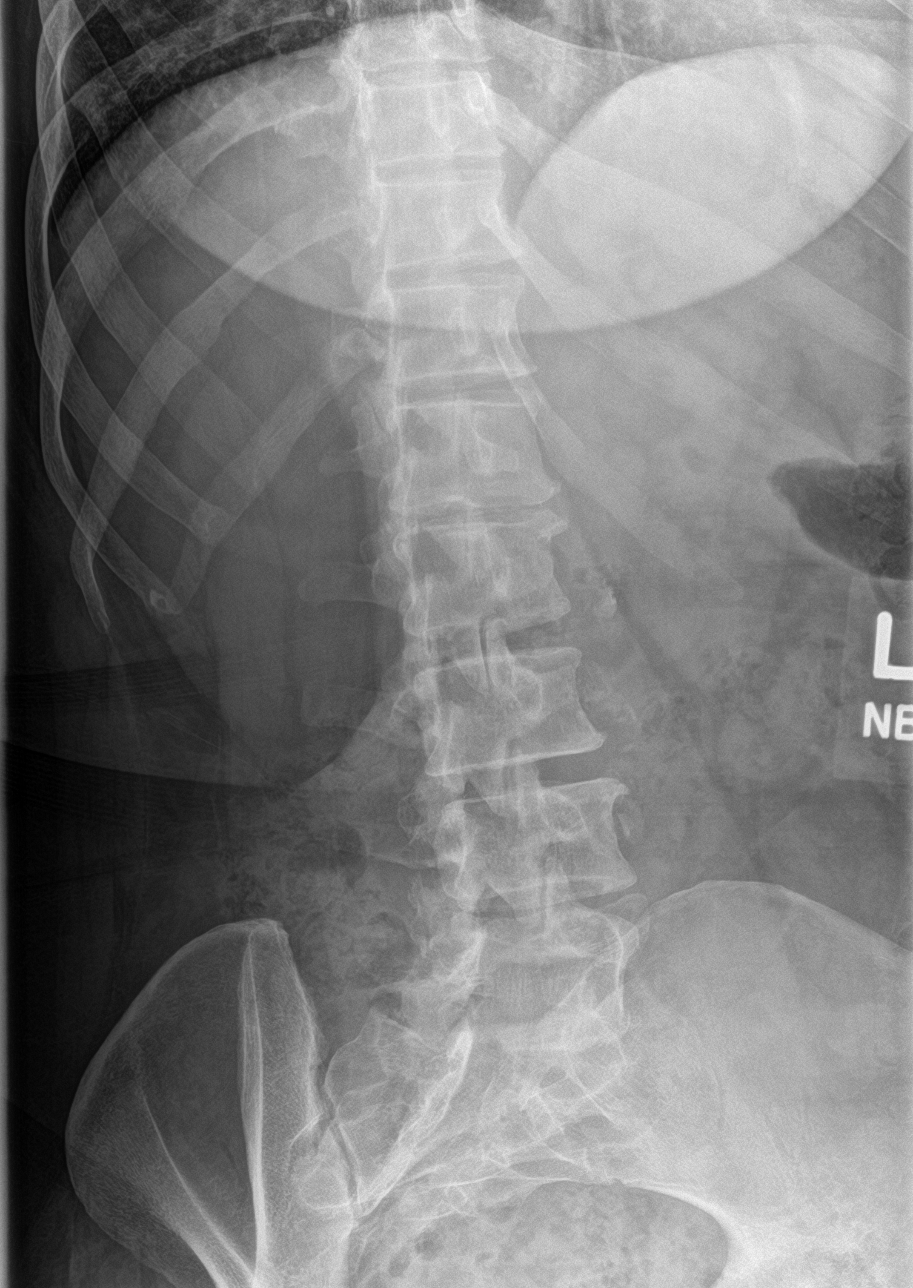

[l-spine lat]
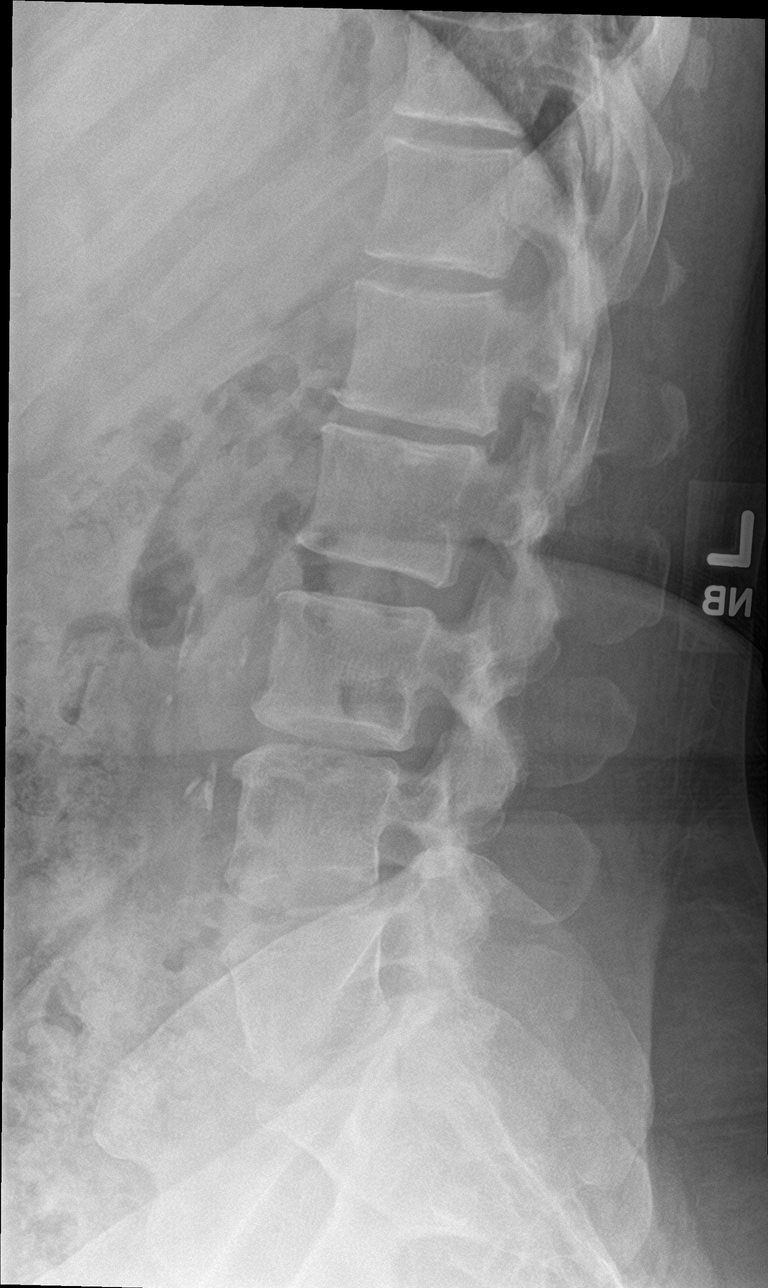

[l-spine spot]
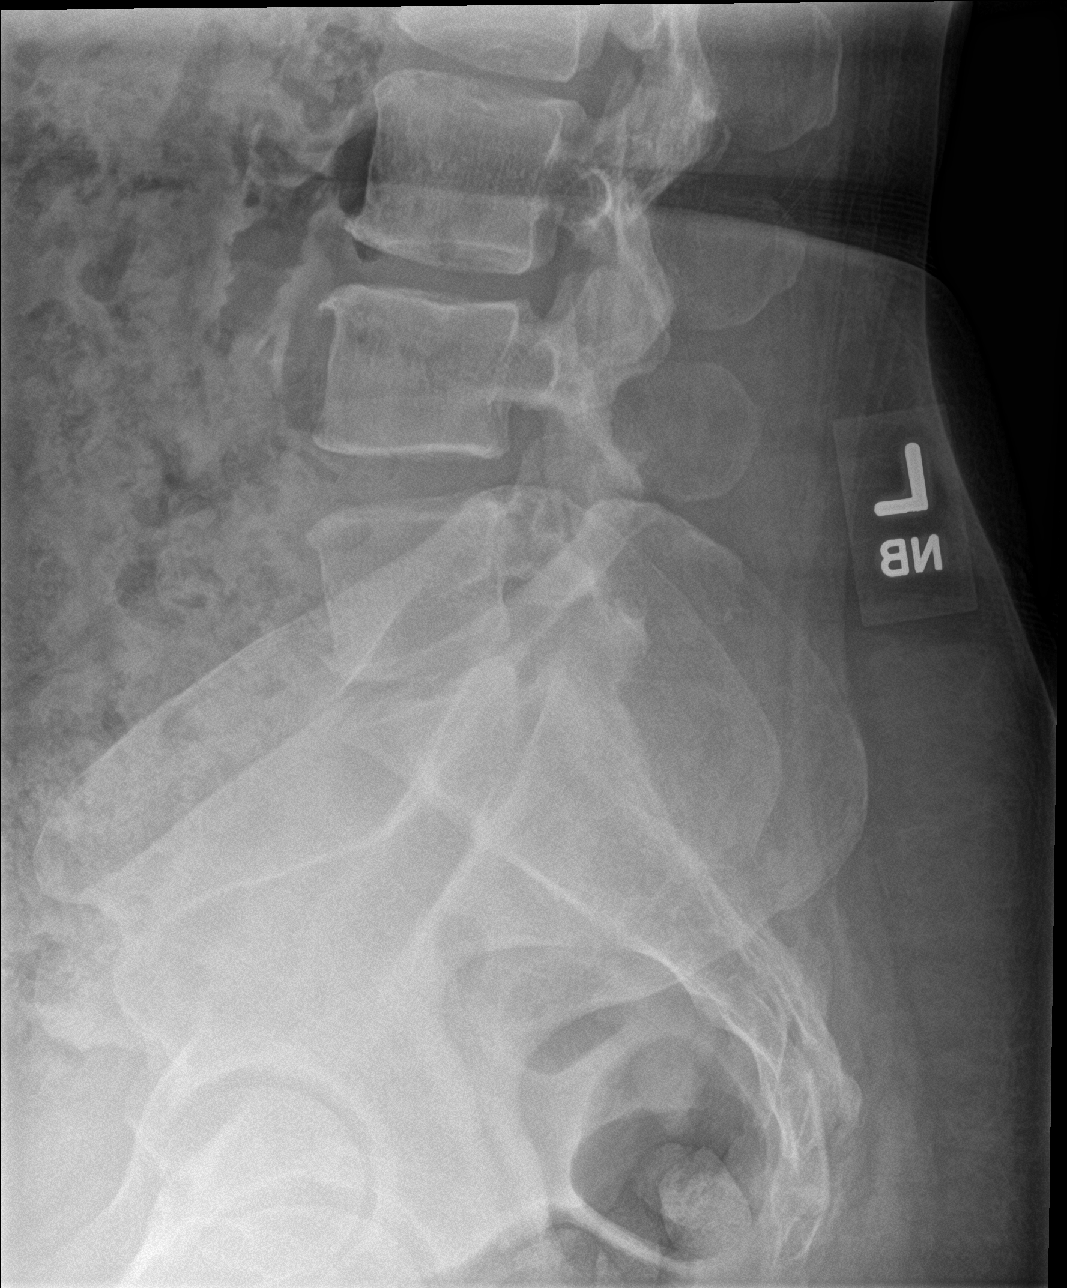

[5 of 5 positions shown; findings below may reference images not displayed]

FINDINGS: There is no evidence of lumbar spine fracture. Alignment is normal.
Moderate degenerative disc disease is noted at L1-2 and L3-4 and
L5-S1.
IMPRESSION: Moderate multilevel degenerative disc disease. No acute abnormality
is noted.

## 2023-09-04 ENCOUNTER — Ambulatory Visit: Admission: EM | Admit: 2023-09-04 | Discharge: 2023-09-04 | Disposition: A | Discharge status: Home / Self Care

## 2023-09-04 DIAGNOSIS — J111 Influenza due to unidentified influenza virus with other respiratory manifestations: Secondary | ICD-10-CM | POA: Diagnosis not present

## 2023-09-04 DIAGNOSIS — R059 Cough, unspecified: Secondary | ICD-10-CM

## 2023-09-04 LAB — POCT INFLUENZA A/B
Influenza A, POC: NEGATIVE
Influenza B, POC: NEGATIVE

## 2023-09-04 LAB — POC SARS CORONAVIRUS 2 AG -  ED: SARS Coronavirus 2 Ag: NEGATIVE

## 2023-09-04 MED ORDER — PREDNISONE 20 MG PO TABS: ORAL_TABLET | ORAL | 0 refills | Status: DC

## 2023-09-04 NOTE — Discharge Instructions (Addendum)
 Advised patient to take medication as directed with food to completion.  Encouraged to increase daily water intake to 64 ounces per day while taking this medication.  Advised if symptoms worsen and/or unresolved please follow-up with your PCP or here for further evaluation.

## 2023-09-04 NOTE — ED Triage Notes (Signed)
 Pt presents to uc with co cold, clammy, runny, nose, chest congestion, decreased smell, ha, cough since Tuesday. Pt has taken benadryl and otc allergy medication with no improvement.

## 2023-09-04 NOTE — ED Provider Notes (Signed)
 [facility]    CSN: [id number] Arrival date & time: 09/04/23  1334      History   Chief Complaint Chief Complaint  Patient presents with   URI    HPI Haley Taylor is a 43 y.o. female.   HPI 43 year old female presents with sweats, chills, congestion, cough, sore throat for d days.  PMH significant for HTN and obesity.  Past Medical History:  Diagnosis Date   Hypertension     There are no active problems to display for this patient.   History reviewed. No pertinent surgical history.  OB History   No obstetric history on file.      Home Medications    Prior to Admission medications   Medication Sig Start Date End Date Taking? Authorizing Provider  fluticasone (FLONASE) 50 MCG/ACT nasal spray Place 2 sprays into the nose daily. 01/09/21  Yes [provider]  hydrochlorothiazide (HYDRODIURIL) 12.5 MG tablet Take 12.5 mg by mouth. 08/04/23  Yes [provider]  predniSONE  (DELTASONE ) 20 MG tablet Take 3 tabs PO daily x 5 days. 09/04/23  Yes Leonides Ramp, FNP  albuterol  (VENTOLIN  HFA) 108 (90 Base) MCG/ACT inhaler Inhale 1-2 puffs into the lungs every 6 (six) hours as needed for wheezing or shortness of breath. 05/02/20   Buena Carmine, NP  aspirin 81 MG chewable tablet Chew by mouth daily.    [provider]  cetirizine (ZYRTEC) 5 MG tablet Take 5 mg by mouth daily.    [provider]  Cyanocobalamin (VITAMIN B 12 PO) Take by mouth.    [provider]  escitalopram (LEXAPRO) 20 MG tablet Take 20 mg by mouth daily.    [provider]  ferrous sulfate 325 (65 FE) MG tablet Take 325 mg by mouth daily with breakfast.    [provider]    Family History Family History  Problem Relation Age of Onset   COPD Mother     Social History Social History   Tobacco Use   Smoking status: Every Day    Current packs/day: 1.00    Average packs/day: 1 pack/day for 23.0 years (23.0 ttl pk-yrs)     Types: Cigarettes    Start date: 09/03/2000   Smokeless tobacco: Never  Vaping Use   Vaping status: Never Used  Substance Use Topics   Alcohol use: Not Currently     Allergies   Amoxicillin and Keflex [cephalexin]   Review of Systems Review of Systems  Constitutional:  Positive for chills.  HENT:  Positive for congestion and rhinorrhea.   Respiratory:  Positive for cough.        Chest congestion     Physical Exam Triage Vital Signs ED Triage Vitals  Encounter Vitals Group     BP      Systolic BP Percentile      Diastolic BP Percentile      Pulse      Resp      Temp      Temp src      SpO2      Weight      Height      Head Circumference      Peak Flow      Pain Score      Pain Loc      Pain Education      Exclude from Growth Chart    No data found.  Updated Vital Signs BP (!) 163/91   Pulse (!) 101   Temp 98.5  F (36.9 C)   Resp 16   LMP  (LMP Unknown)   SpO2 98%     Physical Exam Vitals and nursing note reviewed.  Constitutional:      Appearance: Normal appearance. She is obese. She is not ill-appearing.  HENT:     Head: Normocephalic and atraumatic.     Right Ear: Tympanic membrane, ear canal and external ear normal.     Left Ear: Tympanic membrane, ear canal and external ear normal.     Mouth/Throat:     Mouth: Mucous membranes are moist.     Pharynx: Oropharynx is clear.  Eyes:     Extraocular Movements: Extraocular movements intact.     Conjunctiva/sclera: Conjunctivae normal.     Pupils: Pupils are equal, round, and reactive to light.  Cardiovascular:     Rate and Rhythm: Normal rate and regular rhythm.     Pulses: Normal pulses.     Heart sounds: Normal heart sounds.  Pulmonary:     Effort: Pulmonary effort is normal.     Breath sounds: Normal breath sounds. No wheezing, rhonchi or rales.     Comments: Infrequent nonproductive cough on exam Musculoskeletal:        General: Normal range of motion.     Cervical back: Normal range of  motion and neck supple.  Skin:    General: Skin is warm and dry.  Neurological:     General: No focal deficit present.     Mental Status: She is alert and oriented to person, place, and time.  Psychiatric:        Mood and Affect: Mood normal.        Behavior: Behavior normal.      UC Treatments / Results  Labs (all labs ordered are listed, but only abnormal results are displayed) Labs Reviewed  POCT INFLUENZA A/B  POC SARS CORONAVIRUS 2 AG -  ED    EKG   Radiology No results found.  Procedures Procedures (including critical Taylor time)  Medications Ordered in UC Medications - No data to display  Initial Impression / Assessment and Plan / UC Course  I have reviewed the triage vital signs and the nursing notes.  Pertinent labs & imaging results that were available during my Taylor of the patient were reviewed by me and considered in my medical decision making (see chart for details).     MDM: 1.  Cough, unspecified type-Rx'd prednisone  20 mg tablet: Take 3 tablets p.o. daily x 5 days; 2.  Influenza-like illness-COVID-19 and influenza A/B- this afternoon. Advised patient to take medication as directed with food to completion.  Encouraged to increase daily water intake to 64 ounces per day while taking this medication.  Advised if symptoms worsen and/or unresolved please follow-up with your PCP or here for further evaluation.  Work note provided to patient prior to discharge today.  Patient discharged home, hemodynamically stable. Final Clinical Impressions(s) / UC Diagnoses   Final diagnoses:  Influenza-like illness  Cough, unspecified type     Discharge Instructions      Advised patient to take medication as directed with food to completion.  Encouraged to increase daily water intake to 64 ounces per day while taking this medication.  Advised if symptoms worsen and/or unresolved please follow-up with your PCP or here for further evaluation.   ED Prescriptions      Medication Sig Dispense Auth. Provider   predniSONE  (DELTASONE ) 20 MG tablet Take 3 tabs PO daily x 5 days. 15  tablet Lillyan Hitson, FNP      PDMP not reviewed this encounter.   Leonides Ramp, FNP 09/04/23 1515

## 2024-03-18 ENCOUNTER — Ambulatory Visit
Admission: EM | Admit: 2024-03-18 | Discharge: 2024-03-18 | Disposition: A | Discharge status: Home / Self Care | Attending: Family Medicine | Admitting: Family Medicine

## 2024-03-18 ENCOUNTER — Other Ambulatory Visit: Payer: Self-pay

## 2024-03-18 DIAGNOSIS — M62830 Muscle spasm of back: Secondary | ICD-10-CM

## 2024-03-18 DIAGNOSIS — M5442 Lumbago with sciatica, left side: Secondary | ICD-10-CM

## 2024-03-18 MED ORDER — METHYLPREDNISOLONE SODIUM SUCC 125 MG IJ SOLR
125.0000 mg | Freq: Once | INTRAMUSCULAR | Status: AC
  Administered 2024-03-18: 125 mg via INTRAMUSCULAR

## 2024-03-18 MED ORDER — METHOCARBAMOL 500 MG PO TABS: 500.0000 mg | ORAL_TABLET | Freq: Two times a day (BID) | ORAL | 0 refills | Status: AC

## 2024-03-18 MED ORDER — PREDNISONE 10 MG (21) PO TBPK: ORAL_TABLET | Freq: Every day | ORAL | 0 refills | Status: AC

## 2024-03-18 NOTE — Discharge Instructions (Addendum)
 Advised patient take medication as directed with food to completion.  Advised may use Robaxin daily or as needed for accompanying muscle spasms of back.  Encouraged to increase daily water intake to 64 ounces per day while taking these medications.  Advised if symptoms worsen and/or unresolved please follow-up with your PCP or here for further evaluation.

## 2024-03-18 NOTE — ED Triage Notes (Signed)
 Pt presenting c/o lower back pain radiating to her left leg. Pt stated that she had back surgery years ago. and that pain is chronic however got worse throughout last night. Pt stated that she used Ibuprofen 800 mg last night and  Domes back rub with minimal effectiveness.

## 2024-03-18 NOTE — ED Provider Notes (Signed)
 Haley Taylor    CSN: [id number] Arrival date & time: 03/18/24  1048      History   Chief Complaint Chief Complaint  Patient presents with   Back Pain    HPI Haley Taylor is a 43 y.o. female.   HPI pleasant 43 year old female presents with for days.   PMH significant for obesity, HTN, and nicotine dependence.  Past Medical History:  Diagnosis Date   Hypertension     There are no active problems to display for this patient.   History reviewed. No pertinent surgical history.  OB History   No obstetric history on file.      Home Medications    Prior to Admission medications   Medication Sig Start Date End Date Taking? Authorizing Provider  lisinopril (ZESTRIL) 40 MG tablet Take 40 mg by mouth daily.   Yes [provider]  methocarbamol  (ROBAXIN ) 500 MG tablet Take 1 tablet (500 mg total) by mouth 2 (two) times daily. 03/18/24  Yes Teddy Sharper, FNP  predniSONE  (STERAPRED UNI-PAK 21 TAB) 10 MG (21) TBPK tablet Take by mouth daily. Take 6 tabs by mouth daily  for 2 days, then 5 tabs for 2 days, then 4 tabs for 2 days, then 3 tabs for 2 days, 2 tabs for 2 days, then 1 tab by mouth daily for 2 days 03/18/24  Yes Teddy Sharper, FNP  albuterol  (VENTOLIN  HFA) 108 (90 Base) MCG/ACT inhaler Inhale 1-2 puffs into the lungs every 6 (six) hours as needed for wheezing or shortness of breath. 05/02/20   Arloa Suzen RAMAN, NP  aspirin 81 MG chewable tablet Chew by mouth daily.    [provider]  cetirizine (ZYRTEC) 5 MG tablet Take 5 mg by mouth daily.    [provider]  Cyanocobalamin (VITAMIN B 12 PO) Take by mouth.    [provider]  escitalopram (LEXAPRO) 20 MG tablet Take 20 mg by mouth daily.    [provider]  ferrous sulfate 325 (65 FE) MG tablet Take 325 mg by mouth daily with breakfast.    [provider]  hydrochlorothiazide (HYDRODIURIL) 12.5 MG tablet Take 12.5 mg by mouth. 08/04/23   [provider]    Family History Family History  Problem Relation Age of Onset   COPD Mother     Social History Social History   Tobacco Use   Smoking status: Every Day    Current packs/day: 1.00    Average packs/day: 1 pack/day for 23.5 years (23.5 ttl pk-yrs)    Types: Cigarettes    Start date: 09/03/2000   Smokeless tobacco: Current  Vaping Use   Vaping status: Never Used  Substance Use Topics   Alcohol use: Yes    Comment: occ   Drug use: Not Currently     Allergies   Amoxicillin and Keflex [cephalexin]   Review of Systems Review of Systems  Musculoskeletal:  Positive for back pain.     Physical Exam Triage Vital Signs ED Triage Vitals  Encounter Vitals Group     BP      Girls Systolic BP Percentile      Girls Diastolic BP Percentile      Boys Systolic BP Percentile      Boys Diastolic BP Percentile      Pulse      Resp      Temp      Temp src      SpO2      Weight  Height      Head Circumference      Peak Flow      Pain Score      Pain Loc      Pain Education      Exclude from Growth Chart    No data found.  Updated Vital Signs BP (!) 157/96 (BP Location: Right Arm)   Pulse 84   Temp 98.4 F (36.9 C) (Oral)   Resp 18   Ht 5' 6 (1.676 m)   Wt 189 lb (85.7 kg)   LMP 03/13/2024   SpO2 99%   BMI 30.51 kg/m   \   Physical Exam Vitals and nursing note reviewed.  Constitutional:      General: She is not in acute distress.    Appearance: Normal appearance. She is normal weight. She is not ill-appearing, toxic-appearing or diaphoretic.  HENT:     Head: Normocephalic and atraumatic.     Mouth/Throat:     Mouth: Mucous membranes are moist.     Pharynx: Oropharynx is clear.  Eyes:     Extraocular Movements: Extraocular movements intact.     Conjunctiva/sclera: Conjunctivae normal.     Pupils: Pupils are equal, round, and reactive to light.  Cardiovascular:     Rate and Rhythm: Normal rate and regular rhythm.     Heart sounds:  Normal heart sounds.  Pulmonary:     Effort: Pulmonary effort is normal.     Breath sounds: Normal breath sounds. No wheezing, rhonchi or rales.  Abdominal:     Tenderness: There is no right CVA tenderness or left CVA tenderness.  Musculoskeletal:        General: Normal range of motion.     Cervical back: Normal range of motion and neck supple.     Comments: Lumbar spine (left-sided inferior aspect): Patient reporting pain over L5-S1 no deformity noted  Skin:    General: Skin is warm and dry.  Neurological:     General: No focal deficit present.     Mental Status: She is alert and oriented to person, place, and time. Mental status is at baseline.  Psychiatric:        Mood and Affect: Mood normal.        Behavior: Behavior normal.      UC Treatments / Results  Labs (all labs ordered are listed, but only abnormal results are displayed) Labs Reviewed - No data to display  EKG   Radiology No results found.  Procedures Procedures (including critical Taylor time)  Medications Ordered in UC Medications  methylPREDNISolone  sodium succinate (SOLU-MEDROL ) 125 mg/2 mL injection 125 mg (125 mg Intramuscular Given 03/18/24 1145)    Initial Impression / Assessment and Plan / UC Course  I have reviewed the triage vital signs and the nursing notes.  Pertinent labs & imaging results that were available during my Taylor of the patient were reviewed by me and considered in my medical decision making (see chart for details).     MDM: 1.  Acute midline low back pain with left-sided sciatica-IM Solu-Medrol  125 mg given once in clinic and prior to discharge, Rx'd Sterapred Unipak (42 tab 10 mg taper) take as directed; 2.  Muscle spasm of back-Rx'd Robaxin 5 mg tablet: Take 1 tablet twice daily, as needed x 10 days.  Complete 4 view LS-spine of 09/19/2021 reveals moderate multilevel degenerative disc disease noted at L1-2, L3-4, and L5-S1. Advised patient take medication as directed with food to  completion.  Advised may  use Robaxin daily or as needed for accompanying muscle spasms of back.  Encouraged to increase daily water intake to 64 ounces per day while taking these medications.  Advised if symptoms worsen and/or unresolved please follow-up with your PCP or here for further evaluation. Final Clinical Impressions(s) / UC Diagnoses   Final diagnoses:  Acute midline low back pain with left-sided sciatica  Muscle spasm of back     Discharge Instructions      Advised patient take medication as directed with food to completion.  Advised may use Robaxin daily or as needed for accompanying muscle spasms of back.  Encouraged to increase daily water intake to 64 ounces per day while taking these medications.  Advised if symptoms worsen and/or unresolved please follow-up with your PCP or here for further evaluation.     ED Prescriptions     Medication Sig Dispense Auth. Provider   predniSONE  (STERAPRED UNI-PAK 21 TAB) 10 MG (21) TBPK tablet Take by mouth daily. Take 6 tabs by mouth daily  for 2 days, then 5 tabs for 2 days, then 4 tabs for 2 days, then 3 tabs for 2 days, 2 tabs for 2 days, then 1 tab by mouth daily for 2 days 42 tablet Teddy Sharper, FNP   methocarbamol (ROBAXIN) 500 MG tablet Take 1 tablet (500 mg total) by mouth 2 (two) times daily. 20 tablet Morning Halberg, FNP      PDMP not reviewed this encounter.   Teddy Sharper, FNP 03/18/24 1205
